# Patient Record
Sex: Male | Born: 2000 | Race: White | Hispanic: No | Marital: Single | Smoking: Never smoker
Health system: Southern US, Community
[De-identification: ages and names within clinical notes are randomized; demographics above are authoritative.]

## PROBLEM LIST (undated history)

## (undated) HISTORY — PX: TONSILLECTOMY AND ADENOIDECTOMY: SUR1326

---
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
## 2019-07-14 ENCOUNTER — Other Ambulatory Visit: Payer: Self-pay

## 2019-07-14 ENCOUNTER — Encounter: Payer: Self-pay | Admitting: Podiatry

## 2019-07-14 ENCOUNTER — Ambulatory Visit: Payer: BC Managed Care – PPO | Admitting: Podiatry

## 2019-07-14 VITALS — BP 132/78 | HR 69 | Resp 16

## 2019-07-14 DIAGNOSIS — S90212A Contusion of left great toe with damage to nail, initial encounter: Secondary | ICD-10-CM

## 2019-07-15 ENCOUNTER — Encounter: Payer: Self-pay | Admitting: Podiatry

## 2019-07-15 NOTE — Progress Notes (Signed)
  Subjective:  Patient ID: Bryce Porter, male    DOB: [birth year],  MRN: [id number] HPI Chief Complaint  Patient presents with  . Nail Problem    Hallux nails bilateral (L>R) - bruised nails x few days, typically happens after he plays basketball, tried ice  . New Patient (Initial Visit)    18 y.o. male presents with the above complaint.   ROS: He denies fever chills nausea vomiting muscle aches pains calf pain back pain chest pain shortness of breath.  States that he is had this before but it only happens after he is been playing basketball.  No past medical history on file. Past Surgical History:  Procedure Laterality Date  . TONSILLECTOMY AND ADENOIDECTOMY      Current Outpatient Medications:  .  diphenhydrAMINE HCl (ALLERGY MEDICATION PO), Take by mouth., Disp: , Rfl:   No Known Allergies Review of Systems Objective:   Vitals:   07/14/19 1545  BP: 132/78  Pulse: 69  Resp: 16    General: Well developed, nourished, in no acute distress, alert and oriented x3   Dermatological: Skin is warm, dry and supple bilateral. Nails x 10 are well maintained; remaining integument appears unremarkable at this time. There are no open sores, no preulcerative lesions, no rash or signs of infection present.  Subungual hematoma toenail is not loose there is no signs of infection no purulence no drainage.  Hallux only bilateral.  Vascular: Dorsalis Pedis artery and Posterior Tibial artery pedal pulses are 2/4 bilateral with immedate capillary fill time. Pedal hair growth present. No varicosities and no lower extremity edema present bilateral.   Neruologic: Grossly intact via light touch bilateral. Vibratory intact via tuning fork bilateral. Protective threshold with Semmes Wienstein monofilament intact to all pedal sites bilateral. Patellar and Achilles deep tendon reflexes 2+ bilateral. No Babinski or clonus noted bilateral.   Musculoskeletal: No gross boney pedal deformities bilateral.  No pain, crepitus, or limitation noted with foot and ankle range of motion bilateral. Muscular strength 5/5 in all groups tested bilateral.  Gait: Unassisted, Nonantalgic.    Radiographs:  None taken  Assessment & Plan:   Assessment: Subungual hematoma hallux only bilateral  Plan: Discussed appropriate shoe gear length and depth in type of shoe demonstrating for several minutes how to lace the shoes properly as to keep his foot from sliding to the end of the shoe.  Evaluated the shoes to make sure that they were of correct length and depth.     Bryce Porter, [place]

## 2019-10-20 ENCOUNTER — Ambulatory Visit: Payer: BC Managed Care – PPO | Admitting: Podiatry

## 2020-04-06 NOTE — Progress Notes (Signed)
Subjective:    Patient ID: Bryce Porter, male    DOB: 2000-11-14, 19 y.o.   MRN: [id number]  HPI The patient is here for an acute visit for facial acne, hair loss.  His mom is here with him.  For about two years his hair has been thinning and he has face acne .  Initially they thought it was stress.  Has seen derm and anesthesist.   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  They recommended thyroid check.   He is doing treatments for his skin.    His energy level is good and denies other symptoms.  His mom has hypothyroidism.     Medications and allergies reviewed with patient and updated if appropriate.  Patient Active Problem List   Diagnosis Date Noted  . Acne vulgaris 04/07/2020  . Hair loss 04/07/2020    Current Outpatient Medications on File Prior to Visit  Medication Sig Dispense Refill  . diphenhydrAMINE HCl (ALLERGY MEDICATION PO) Take by mouth.     No current facility-administered medications on file prior to visit.    History reviewed. No pertinent past medical history.  Past Surgical History:  Procedure Laterality Date  . TONSILLECTOMY AND ADENOIDECTOMY      Social History   Socioeconomic History  . Marital status: Single    Spouse name: Not on file  . Number of children: Not on file  . Years of education: Not on file  . Highest education level: Not on file  Occupational History  . Not on file  Tobacco Use  . Smoking status: Never Smoker  . Smokeless tobacco: Never Used  Substance and Sexual Activity  . Alcohol use: Not Currently  . Drug use: Never  . Sexual activity: Not Currently  Other Topics Concern  . Not on file  Social History Narrative  . Not on file   Social Determinants of Health   Financial Resource Strain:   . Difficulty of Paying Living Expenses:   Food Insecurity:   . Worried About Charity fundraiser in the Last Year:   . Arboriculturist in the Last Year:   Transportation  Needs:   . Film/video editor (Medical):   Marland Kitchen Lack of Transportation (Non-Medical):   Physical Activity:   . Days of Exercise per Week:   . Minutes of Exercise per Session:   Stress:   . Feeling of Stress :   Social Connections:   . Frequency of Communication with Friends and Family:   . Frequency of Social Gatherings with Friends and Family:   . Attends Religious Services:   . Active Member of Clubs or Organizations:   . Attends Archivist Meetings:   Marland Kitchen Marital Status:     Family History  Problem Relation Age of Onset  . Arthritis Mother   . Cancer Mother   . Hypertension Father   . Cancer Maternal Grandmother   . Early death Maternal Grandmother   . Early death Maternal Grandfather   . Alcohol abuse Maternal Grandfather   . Early death Paternal Grandmother   . Heart disease Paternal Grandmother   . Arthritis Paternal Grandfather     Review of Systems  Constitutional: Negative for fatigue and unexpected weight change.  Respiratory: Negative for cough, shortness of breath and wheezing.   Cardiovascular: Negative for chest pain and palpitations.  Gastrointestinal: Negative for abdominal pain and constipation.  Musculoskeletal: Negative for arthralgias and back pain.  Skin:  Male pattern baldness, acne  Neurological: Negative for headaches.       Objective:   Vitals:   04/07/20 1123  BP: 122/70  Pulse: (!) 101  Temp: 98.3 F (36.8 C)  SpO2: 98%   BP Readings from Last 3 Encounters:  04/07/20 122/70  07/14/19 132/78  06/11/18 112/70   Wt Readings from Last 3 Encounters:  04/07/20 156 lb (70.8 kg)  06/11/18 159 lb (72.1 kg) (63 %, Z= 0.34)*   * Growth percentiles are based on CDC (Boys, 2-20 Years) data.   Body mass index is 19.5 kg/m.   Physical Exam    Constitutional: Appears well-developed and well-nourished. No distress.  Head: Normocephalic and atraumatic.  Neck: Neck supple. No tracheal deviation present. No thyromegaly  present.  No cervical lymphadenopathy Cardiovascular: Normal rate, regular rhythm and normal heart sounds.  No murmur heard. No carotid bruit .  No edema Pulmonary/Chest: Effort normal and breath sounds normal. No respiratory distress. No has no wheezes. No rales.  Skin: Skin is warm and dry. Not diaphoretic. Hair on head slightly thin, male pattern baldness.  Normal arm and leg hair distribution Psychiatric: Normal mood and affect. Behavior is normal.       Assessment & Plan:    See Problem List for Assessment and Plan of chronic medical problems.     This visit occurred during the SARS-CoV-2 public health emergency.  Safety protocols were in place, including screening questions prior to the visit, additional usage of staff PPE, and extensive cleaning of exam room while observing appropriate contact time as indicated for disinfecting solutions.

## 2020-04-07 ENCOUNTER — Encounter: Payer: Self-pay | Admitting: Internal Medicine

## 2020-04-07 ENCOUNTER — Other Ambulatory Visit: Payer: Self-pay

## 2020-04-07 ENCOUNTER — Ambulatory Visit: Payer: BC Managed Care – PPO | Admitting: Internal Medicine

## 2020-04-07 VITALS — BP 122/70 | HR 101 | Temp 98.3°F | Ht 75.0 in | Wt 156.0 lb

## 2020-04-07 DIAGNOSIS — L659 Nonscarring hair loss, unspecified: Secondary | ICD-10-CM

## 2020-04-07 DIAGNOSIS — L700 Acne vulgaris: Secondary | ICD-10-CM | POA: Diagnosis not present

## 2020-04-07 LAB — CBC WITH DIFFERENTIAL/PLATELET
Basophils Absolute: 0 10*3/uL (ref 0.0–0.1)
Basophils Relative: 0.6 % (ref 0.0–3.0)
Eosinophils Absolute: 0.1 10*3/uL (ref 0.0–0.7)
Eosinophils Relative: 2.1 % (ref 0.0–5.0)
HCT: 46 % (ref 39.0–52.0)
Hemoglobin: 15.9 g/dL (ref 13.0–17.0)
Lymphocytes Relative: 36.5 % (ref 12.0–46.0)
Lymphs Abs: 1.7 10*3/uL (ref 0.7–4.0)
MCHC: 34.6 g/dL (ref 30.0–36.0)
MCV: 84.7 fl (ref 78.0–100.0)
Monocytes Absolute: 0.4 10*3/uL (ref 0.1–1.0)
Monocytes Relative: 8.8 % (ref 3.0–12.0)
Neutro Abs: 2.4 10*3/uL (ref 1.4–7.7)
Neutrophils Relative %: 52 % (ref 43.0–77.0)
Platelets: 189 10*3/uL (ref 150.0–400.0)
RBC: 5.43 Mil/uL (ref 4.22–5.81)
RDW: 12.4 % (ref 11.5–14.6)
WBC: 4.6 10*3/uL (ref 4.5–10.5)

## 2020-04-07 LAB — COMPREHENSIVE METABOLIC PANEL
ALT: 10 U/L (ref 0–53)
AST: 13 U/L (ref 0–37)
Albumin: 5.2 g/dL (ref 3.5–5.2)
Alkaline Phosphatase: 79 U/L (ref 39–117)
BUN: 11 mg/dL (ref 6–23)
CO2: 30 mEq/L (ref 19–32)
Calcium: 10.1 mg/dL (ref 8.4–10.5)
Chloride: 102 mEq/L (ref 96–112)
Creatinine, Ser: 1.09 mg/dL (ref 0.40–1.50)
GFR: 85.98 mL/min (ref >60.00)
Glucose, Bld: 94 mg/dL (ref 70–99)
Potassium: 3.8 mEq/L (ref 3.5–5.1)
Sodium: 139 mEq/L (ref 135–145)
Total Bilirubin: 0.9 mg/dL (ref 0.2–1.2)
Total Protein: 7.7 g/dL (ref 6.0–8.3)

## 2020-04-07 LAB — VITAMIN B12: Vitamin B-12: 349 pg/mL (ref 211–911)

## 2020-04-07 LAB — TSH: TSH: 2.91 u[IU]/mL (ref 0.35–5.50)

## 2020-04-07 NOTE — Patient Instructions (Signed)
Blood work was ordered.

## 2020-04-07 NOTE — Assessment & Plan Note (Signed)
Acute Hair on head thinning and male pattern baldness Following with derm - have discussed treatments ? thyroid issues, ? Vitamin def, ? anemia Cbc, cmp, b12, tsh

## 2020-04-07 NOTE — Assessment & Plan Note (Signed)
Acute Facial acne x 2 years Seeing derm and doing treatment

## 2020-06-16 ENCOUNTER — Encounter: Payer: Self-pay | Admitting: Internal Medicine
